# Patient Record
Sex: Female | Born: 2000 | Race: White | Hispanic: No | Marital: Single | Smoking: Never smoker
Health system: Southern US, Community
[De-identification: ages and names within clinical notes are randomized; demographics above are authoritative.]

## PROBLEM LIST (undated history)

## (undated) DIAGNOSIS — L509 Urticaria, unspecified: Secondary | ICD-10-CM

## (undated) DIAGNOSIS — J309 Allergic rhinitis, unspecified: Secondary | ICD-10-CM

## (undated) HISTORY — PX: WISDOM TOOTH EXTRACTION: SHX21

## (undated) HISTORY — PX: TONSILLECTOMY: SUR1361

## (undated) HISTORY — DX: Urticaria, unspecified: L50.9

## (undated) HISTORY — PX: ADENOIDECTOMY: SUR15

## (undated) HISTORY — DX: Allergic rhinitis, unspecified: J30.9

---
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
## 2019-08-22 ENCOUNTER — Encounter: Payer: Self-pay | Admitting: Nurse Practitioner

## 2019-09-17 ENCOUNTER — Ambulatory Visit: Payer: Managed Care, Other (non HMO) | Admitting: Nurse Practitioner

## 2019-09-19 ENCOUNTER — Ambulatory Visit: Payer: Self-pay | Admitting: Allergy and Immunology

## 2019-10-01 ENCOUNTER — Ambulatory Visit: Payer: Self-pay | Admitting: Allergy

## 2019-10-14 ENCOUNTER — Other Ambulatory Visit: Payer: Self-pay

## 2019-10-14 ENCOUNTER — Encounter: Payer: Self-pay | Admitting: Allergy and Immunology

## 2019-10-14 ENCOUNTER — Ambulatory Visit (INDEPENDENT_AMBULATORY_CARE_PROVIDER_SITE_OTHER): Payer: Managed Care, Other (non HMO) | Admitting: Allergy and Immunology

## 2019-10-14 VITALS — BP 112/68 | HR 88 | Resp 18 | Ht 63.0 in | Wt 181.4 lb

## 2019-10-14 DIAGNOSIS — T781XXA Other adverse food reactions, not elsewhere classified, initial encounter: Secondary | ICD-10-CM | POA: Diagnosis not present

## 2019-10-14 DIAGNOSIS — E739 Lactose intolerance, unspecified: Secondary | ICD-10-CM

## 2019-10-14 DIAGNOSIS — J301 Allergic rhinitis due to pollen: Secondary | ICD-10-CM | POA: Diagnosis not present

## 2019-10-14 DIAGNOSIS — H1010 Acute atopic conjunctivitis, unspecified eye: Secondary | ICD-10-CM

## 2019-10-14 DIAGNOSIS — J3089 Other allergic rhinitis: Secondary | ICD-10-CM

## 2019-10-14 MED ORDER — AUVI-Q 0.3 MG/0.3ML IJ SOAJ: 0.3000 mg | INTRAMUSCULAR | 3 refills | Status: DC | PRN

## 2019-10-14 NOTE — Patient Instructions (Addendum)
  1.  Allergen avoidance measures - pollens, cat, dog, dust mite, mold, specific foods  2.  Treat and prevent inflammation:   A. Montelukast 10 mg - 1 tablet 1 time per day  B. OTC Nasacort / Rhinocort - 1-2 sprays each nostril 3-7 times per week  3. If needed:   A. OTC Pataday - 1 tablet 1 time per day  B. OTC antihistamine - claritin / zyrtec 10 mg - 1 time per day  C. OTC nasal saline  D. OTC Lactaid    E. Auvi-Q 0.3, benadryl, MD/ER evaluation for allergy reaction  4. Further evaluation for GI issue?  5. Possible immunotherapy? Yes, If failing medical therapy  6. Obtain fall flu vaccine and Covid vaccine  7. Return to clinic in 1 year or earlier if problem

## 2019-10-14 NOTE — Progress Notes (Signed)
[place] - [place] - [place] - [place] - [place]   NEW PATIENT NOTE  Referring Provider: No ref. provider found Primary Provider: Gordan Payment., MD Date of office visit: 10/14/2019    Subjective:   Chief Complaint:  Victoria Coleman (DOB: [birth year]) is a 19 y.o. female who presents to the clinic on 10/14/2019 with a chief complaint of Allergic Rhinitis  and Urticaria .  HPI: Victoria Coleman presents to this clinic in evaluation of several issues.  First, she has a long history of seasonal allergic rhinitis manifested as nasal congestion and sneezing and itchy red watery eyes that has been a progressive issue since childhood.  Provoking factors for symptoms include exposure to cats and dogs and pollens.  She does believe that the use of montelukast helps the symptoms.  She does not use other medications at this point in time.  Occasionally she will get a sinus infection manifested as head pressure nasal congestion and she had one of these events about a month ago for which she was treated with a Z-Pak and prednisone.  Second, she develops hives.  She gets red raised areas on her skin predominately her lower extremities that never heal with scar or hyperpigmentation and only last a few hours and are very random.  Exposure to cats and dogs does appear to precipitate some of these issues if she has contact with those animals.  This is also been a longstanding issue.  Third, over the course of the past 5 years she has been having issues with food consumption.  She has recurrent abdominal pain and cramping and diarrhea about 3 times per week.  She recently underwent an upper endoscopy and a colonoscopy in investigation of this issue and apparently everything was okay.  She has noticed that when she eats a dairy meal she definitely gets this issue but even sometimes without a dairy meal she will develop this problem.  Significantly, it appears to be lactose that is giving rise to some of this issue  for if she eats lactose-free milk she does well.  Past Medical History:  Diagnosis Date  . Urticaria     Past Surgical History:  Procedure Laterality Date  . ADENOIDECTOMY    . TONSILLECTOMY    . WISDOM TOOTH EXTRACTION      Allergies as of 10/14/2019   No Known Allergies     Medication List      citalopram 20 MG tablet Commonly known as: CELEXA Take 20 mg by mouth 2 (two) times daily.   Junel FE 1/20 1-20 MG-MCG tablet Generic drug: norethindrone-ethinyl estradiol Take 1 tablet by mouth daily.   montelukast 10 MG tablet Commonly known as: Singulair Take 1 tablet (10 mg total) by mouth at bedtime. For cough or wheeze   Vyvanse 70 MG capsule Generic drug: lisdexamfetamine Take 70 mg by mouth every morning.       Review of systems negative except as noted in HPI / PMHx or noted below:  Review of Systems  Constitutional: Negative.   HENT: Negative.   Eyes: Negative.   Respiratory: Negative.   Cardiovascular: Negative.   Gastrointestinal: Negative.   Genitourinary: Negative.   Musculoskeletal: Negative.   Skin: Negative.   Neurological: Negative.   Endo/Heme/Allergies: Negative.   Psychiatric/Behavioral: Negative.     Family History  Problem Relation Age of Onset  . Rheum arthritis Mother   . Ulcerative colitis Brother     Social History   Socioeconomic History  . Marital status:  Single    Spouse name: Not on file  . Number of children: Not on file  . Years of education: Not on file  . Highest education level: Not on file  Occupational History  . Not on file  Tobacco Use  . Smoking status: Passive Smoke Exposure - Never Smoker  . Smokeless tobacco: Never Used  Substance and Sexual Activity  . Alcohol use: Yes    Comment: socially  . Drug use: Never  . Sexual activity: Not on file  Other Topics Concern  . Not on file  Social History Narrative  . Not on file    Environmental and Social history  Lives in a house with a dry environment,  dogs located to the hospital, carpet in the bedroom, plastic on the bed, plastic on the pillow, and no smoking ongoing inside the household.  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  Objective:   Vitals:   10/14/19 1427  BP: 112/68  Pulse: 88  Resp: 18  SpO2: 98%   Height: 5\' 3"  (160 cm) Weight: 181 lb 6.4 oz (82.3 kg)  Physical Exam Constitutional:      Appearance: She is not diaphoretic.  HENT:     Head: Normocephalic.     Right Ear: Tympanic membrane, ear canal and external ear normal.     Left Ear: Tympanic membrane, ear canal and external ear normal.     Nose: Nose normal. No mucosal edema or rhinorrhea.     Mouth/Throat:     Pharynx: Uvula midline. No oropharyngeal exudate.  Eyes:     Conjunctiva/sclera: Conjunctivae normal.  Neck:     Thyroid: No thyromegaly.     Trachea: Trachea normal. No tracheal tenderness or tracheal deviation.  Cardiovascular:     Rate and Rhythm: Normal rate and regular rhythm.     Heart sounds: Normal heart sounds, S1 normal and S2 normal. No murmur heard.   Pulmonary:     Effort: No respiratory distress.     Breath sounds: Normal breath sounds. No stridor. No wheezing or rales.  Lymphadenopathy:     Head:     Right side of head: No tonsillar adenopathy.     Left side of head: No tonsillar adenopathy.     Cervical: No cervical adenopathy.  Skin:    Findings: No erythema or rash.     Nails: There is no clubbing.  Neurological:     Mental Status: She is alert.     Diagnostics: Allergy skin tests were performed.  She demonstrated hypersensitivity to grasses, weeds, trees, mold, dog, cat, and house dust mite.  She also demonstrated hypersensitivity to almond, hazelnut, nut, Trout, tuna, hops, chicken meat, apple, cinnamon.  Assessment and Plan:    1. Perennial allergic rhinitis   2. Seasonal allergic rhinitis due to pollen   3. Seasonal allergic conjunctivitis   4. Adverse food reaction, initial  encounter   5. Lactose intolerance     1.  Allergen avoidance measures - pollens, cat, dog, dust mite, mold, specific foods  2.  Treat and prevent inflammation:   A. Montelukast 10 mg - 1 tablet 1 time per day  B. OTC Nasacort / Rhinocort - 1-2 sprays each nostril 3-7 times per week  3. If needed:   A. OTC Pataday - 1 tablet 1 time per day  B. OTC antihistamine - claritin / zyrtec 10 mg - 1 time per day  C. OTC nasal saline  D. OTC  Lactaid    E. Auvi-Q 0.3, benadryl, MD/ER evaluation for allergy reaction  4. Further evaluation for GI issue?  5. Possible immunotherapy? Yes, If failing medical therapy  6. Obtain fall flu vaccine and Covid vaccine  7. Return to clinic in 1 year or earlier if problem  Victoria Coleman is very atopic and we will get her to perform allergen avoidance measures as best as possible and consistently use some anti-inflammatory agents for her airway as noted above.  She very well could be having an adverse response to specific food consumption giving rise to some of her GI issues and she will need to work through this issue by performing elimination diet directed against each of the foods she demonstrated hypersensitivity to on today skin testing.  We have given her an injectable epinephrine device given the extent of her atopic disease and her risk for developing a food reaction in the future.  She would definitely be a candidate for immunotherapy as she has basically failed medical therapy regarding her atopic disease and she is presently considering this option.  I will see her back in this clinic in 1 year or earlier if there is a problem.  Laurette Schimke, MD Allergy / Immunology [facility]

## 2019-10-15 ENCOUNTER — Encounter: Payer: Self-pay | Admitting: Allergy and Immunology

## 2019-12-16 ENCOUNTER — Telehealth: Payer: Self-pay | Admitting: Allergy and Immunology

## 2019-12-16 NOTE — Telephone Encounter (Signed)
Victoria Coleman and states she has followed Victoria Coleman action plan strictly and it is of no help.  Victoria Coleman would like to know if she can start immunotherapy.

## 2019-12-16 NOTE — Telephone Encounter (Signed)
Left a message for Victoria Coleman to call back. Just need to inform her that she will need to come by the office to sign consent for [place] to make vials for her.

## 2019-12-16 NOTE — Telephone Encounter (Signed)
Please inform Victoria Coleman that she can start immunotherapy.  She will just need to contact us and let us know when she would like to initiate that form of treatment.

## 2020-01-01 ENCOUNTER — Other Ambulatory Visit: Payer: Self-pay | Admitting: Allergy and Immunology

## 2020-01-01 DIAGNOSIS — H1010 Acute atopic conjunctivitis, unspecified eye: Secondary | ICD-10-CM

## 2020-01-01 DIAGNOSIS — J3081 Allergic rhinitis due to animal (cat) (dog) hair and dander: Secondary | ICD-10-CM | POA: Diagnosis not present

## 2020-01-01 DIAGNOSIS — J301 Allergic rhinitis due to pollen: Secondary | ICD-10-CM

## 2020-01-01 DIAGNOSIS — J3089 Other allergic rhinitis: Secondary | ICD-10-CM

## 2020-01-01 NOTE — Progress Notes (Signed)
VIALS EXP 12-31-20 

## 2020-01-02 DIAGNOSIS — J3089 Other allergic rhinitis: Secondary | ICD-10-CM | POA: Diagnosis not present

## 2020-01-13 ENCOUNTER — Ambulatory Visit (INDEPENDENT_AMBULATORY_CARE_PROVIDER_SITE_OTHER): Payer: Managed Care, Other (non HMO) | Admitting: *Deleted

## 2020-01-13 ENCOUNTER — Other Ambulatory Visit: Payer: Self-pay

## 2020-01-13 DIAGNOSIS — J309 Allergic rhinitis, unspecified: Secondary | ICD-10-CM | POA: Diagnosis not present

## 2020-01-13 NOTE — Progress Notes (Signed)
Immunotherapy   Patient Details  Name: Victoria Coleman MRN: [id number] Date of Birth: 02-20-2001  01/13/2020  Lenis Noon - [facility] ; [facility] (12/31/20) Following schedule: B  Frequency: 1-2 TIMES WEEKLY Epi-Pen: YES Consent signed and patient instructions given.   Redge Gainer 01/13/2020, 10:51 AM

## 2020-01-23 ENCOUNTER — Ambulatory Visit (INDEPENDENT_AMBULATORY_CARE_PROVIDER_SITE_OTHER): Payer: Managed Care, Other (non HMO) | Admitting: *Deleted

## 2020-01-23 DIAGNOSIS — J309 Allergic rhinitis, unspecified: Secondary | ICD-10-CM | POA: Diagnosis not present

## 2020-01-30 ENCOUNTER — Ambulatory Visit (INDEPENDENT_AMBULATORY_CARE_PROVIDER_SITE_OTHER): Payer: Managed Care, Other (non HMO) | Admitting: *Deleted

## 2020-01-30 DIAGNOSIS — J309 Allergic rhinitis, unspecified: Secondary | ICD-10-CM | POA: Diagnosis not present

## 2020-02-19 ENCOUNTER — Ambulatory Visit (INDEPENDENT_AMBULATORY_CARE_PROVIDER_SITE_OTHER): Payer: Managed Care, Other (non HMO) | Admitting: *Deleted

## 2020-02-19 DIAGNOSIS — J309 Allergic rhinitis, unspecified: Secondary | ICD-10-CM

## 2020-03-10 ENCOUNTER — Ambulatory Visit (INDEPENDENT_AMBULATORY_CARE_PROVIDER_SITE_OTHER): Payer: Managed Care, Other (non HMO)

## 2020-03-10 DIAGNOSIS — J309 Allergic rhinitis, unspecified: Secondary | ICD-10-CM

## 2020-04-30 ENCOUNTER — Ambulatory Visit (INDEPENDENT_AMBULATORY_CARE_PROVIDER_SITE_OTHER): Payer: Managed Care, Other (non HMO) | Admitting: *Deleted

## 2020-04-30 DIAGNOSIS — J309 Allergic rhinitis, unspecified: Secondary | ICD-10-CM

## 2020-05-21 ENCOUNTER — Ambulatory Visit (INDEPENDENT_AMBULATORY_CARE_PROVIDER_SITE_OTHER): Payer: Managed Care, Other (non HMO) | Admitting: *Deleted

## 2020-05-21 DIAGNOSIS — J309 Allergic rhinitis, unspecified: Secondary | ICD-10-CM

## 2020-06-11 ENCOUNTER — Ambulatory Visit (INDEPENDENT_AMBULATORY_CARE_PROVIDER_SITE_OTHER): Payer: Managed Care, Other (non HMO) | Admitting: *Deleted

## 2020-06-11 DIAGNOSIS — J309 Allergic rhinitis, unspecified: Secondary | ICD-10-CM | POA: Diagnosis not present

## 2020-06-25 ENCOUNTER — Ambulatory Visit (INDEPENDENT_AMBULATORY_CARE_PROVIDER_SITE_OTHER): Payer: Managed Care, Other (non HMO) | Admitting: *Deleted

## 2020-06-25 DIAGNOSIS — J309 Allergic rhinitis, unspecified: Secondary | ICD-10-CM

## 2020-07-16 ENCOUNTER — Ambulatory Visit (INDEPENDENT_AMBULATORY_CARE_PROVIDER_SITE_OTHER): Payer: Managed Care, Other (non HMO)

## 2020-07-16 DIAGNOSIS — J309 Allergic rhinitis, unspecified: Secondary | ICD-10-CM | POA: Diagnosis not present

## 2020-07-23 ENCOUNTER — Ambulatory Visit (INDEPENDENT_AMBULATORY_CARE_PROVIDER_SITE_OTHER): Payer: Managed Care, Other (non HMO) | Admitting: *Deleted

## 2020-07-23 DIAGNOSIS — J309 Allergic rhinitis, unspecified: Secondary | ICD-10-CM

## 2020-08-13 ENCOUNTER — Ambulatory Visit (INDEPENDENT_AMBULATORY_CARE_PROVIDER_SITE_OTHER): Payer: Managed Care, Other (non HMO) | Admitting: *Deleted

## 2020-08-13 DIAGNOSIS — J309 Allergic rhinitis, unspecified: Secondary | ICD-10-CM | POA: Diagnosis not present

## 2020-10-12 ENCOUNTER — Other Ambulatory Visit: Payer: Self-pay

## 2020-10-12 ENCOUNTER — Encounter: Payer: Self-pay | Admitting: Allergy and Immunology

## 2020-10-12 ENCOUNTER — Ambulatory Visit: Payer: Managed Care, Other (non HMO) | Admitting: Allergy and Immunology

## 2020-10-12 VITALS — BP 118/74 | HR 72 | Resp 16 | Ht 63.0 in | Wt 163.6 lb

## 2020-10-12 DIAGNOSIS — J3089 Other allergic rhinitis: Secondary | ICD-10-CM | POA: Diagnosis not present

## 2020-10-12 DIAGNOSIS — E739 Lactose intolerance, unspecified: Secondary | ICD-10-CM

## 2020-10-12 DIAGNOSIS — J301 Allergic rhinitis due to pollen: Secondary | ICD-10-CM

## 2020-10-12 DIAGNOSIS — H1013 Acute atopic conjunctivitis, bilateral: Secondary | ICD-10-CM | POA: Diagnosis not present

## 2020-10-12 DIAGNOSIS — H1010 Acute atopic conjunctivitis, unspecified eye: Secondary | ICD-10-CM

## 2020-10-12 DIAGNOSIS — T781XXD Other adverse food reactions, not elsewhere classified, subsequent encounter: Secondary | ICD-10-CM

## 2020-10-12 NOTE — Progress Notes (Signed)
[place] - [place] - [place] - [place] - [place]   Follow-up Note  Referring Provider: No ref. provider found Primary Provider: Gordan Payment., MD Date of Office Visit: 10/12/2020  Subjective:   Victoria Coleman (DOB: 07-01-2000) is a 20 y.o. female [facility] in re-evaluation of the following:  HPI: Victoria Coleman to this clinic in reevaluation of allergic rhinoconjunctivitis, adverse food reaction with component of oral food allergy, and apparent lactose intolerance.  Victoria Coleman last visit to this clinic was Victoria Coleman initial evaluation of 14 October 2019.  Victoria Coleman did start a course of immunotherapy towards the latter part of last summer and was doing well with that form of treatment but unfortunately because of the logistic issue and a financial issue Victoria Coleman had to discontinue this form of therapy about 6 weeks ago.  Victoria Coleman did very well this past spring.  Victoria Coleman was able to go through the spring using montelukast and rarely some antihistamine and rarely some nasal steroid and rarely some eyedrops.  The only food that Victoria Coleman remains away from eating at this point in time is chicken and Victoria Coleman does not really eat tree nuts and Victoria Coleman stays away from apples because they give rise to itchiness in Victoria Coleman throat.  If Victoria Coleman is preparing chicken Victoria Coleman will sometimes develop a rash at areas of contact.  Victoria Coleman now can consume not just lactose-free milk but also 1% milk without much difficulty.  Victoria Coleman did not obtain a COVID-vaccine but Victoria Coleman did obtained a flu vaccine last year.  Victoria Coleman was infected with COVID in 2021 without any long-term sequela.  Allergies as of 10/12/2020   No Known Allergies      Medication List    Auvi-Q 0.3 mg/0.3 mL Soaj injection Generic drug: EPINEPHrine Inject 0.3 mLs (0.3 mg total) into the muscle as needed for anaphylaxis.   citalopram 20 MG tablet Commonly known as: CELEXA Take 20 mg by mouth 2 (two) times daily.   Junel FE 1/20 1-20 MG-MCG  tablet Generic drug: norethindrone-ethinyl estradiol-FE Take 1 tablet by mouth daily.   montelukast 10 MG tablet Commonly known as: Singulair Take 1 tablet (10 mg total) by mouth at bedtime. For cough or wheeze   neomycin-polymyxin-hydrocortisone 3.5-10000-1 OTIC suspension Commonly known as: CORTISPORIN SMARTSIG:In Ear(s)   Vyvanse 70 MG capsule Generic drug: lisdexamfetamine Take 70 mg by mouth every morning.        Past Medical History:  Diagnosis Date   Urticaria     Past Surgical History:  Procedure Laterality Date   ADENOIDECTOMY     TONSILLECTOMY     WISDOM TOOTH EXTRACTION      Review of systems negative except as noted in HPI / PMHx or noted below:  Review of Systems  Constitutional: Negative.   HENT: Negative.    Eyes: Negative.   Respiratory: Negative.    Cardiovascular: Negative.   Gastrointestinal: Negative.   Genitourinary: Negative.   Musculoskeletal: Negative.   Skin: Negative.   Neurological: Negative.   Endo/Heme/Allergies: Negative.   Psychiatric/Behavioral: Negative.      Objective:   Vitals:   10/12/20 1344  BP: 118/74  Pulse: 72  Resp: 16  SpO2: 98%   Height: 5\' 3"  (160 cm)  Weight: 163 lb 9.6 oz (74.2 kg)   Physical Exam Constitutional:      Appearance: Victoria Coleman is not diaphoretic.  HENT:     Head: Normocephalic.     Right Ear: Tympanic membrane, ear canal and  external ear normal.     Left Ear: Tympanic membrane, ear canal and external ear normal.     Nose: Nose normal. No mucosal edema or rhinorrhea.     Mouth/Throat:     Pharynx: Uvula midline. No oropharyngeal exudate.  Eyes:     Conjunctiva/sclera: Conjunctivae normal.  Neck:     Thyroid: No thyromegaly.     Trachea: Trachea normal. No tracheal tenderness or tracheal deviation.  Cardiovascular:     Rate and Rhythm: Normal rate and regular rhythm.     Heart sounds: Normal heart sounds, S1 normal and S2 normal. No murmur heard. Pulmonary:     Effort: No respiratory  distress.     Breath sounds: Normal breath sounds. No stridor. No wheezing or rales.  Lymphadenopathy:     Head:     Right side of head: No tonsillar adenopathy.     Left side of head: No tonsillar adenopathy.     Cervical: No cervical adenopathy.  Skin:    Findings: No erythema or rash.     Nails: There is no clubbing.  Neurological:     Mental Status: Victoria Coleman is alert.    Diagnostics: none  Assessment and Plan:   1. Perennial allergic rhinitis   2. Seasonal allergic conjunctivitis   3. Seasonal allergic rhinitis due to pollen   4. Adverse food reaction, subsequent encounter   5. Pollen-food allergy, subsequent encounter   6. Lactose intolerance    1.  Allergen avoidance measures - pollens, cat, dog, dust mite, mold, specific foods  2.  Treat and prevent inflammation:   A. Montelukast 10 mg - 1 tablet 1 time per day  3. If needed:   A. OTC Pataday - 1 tablet 1 time per day  B. OTC antihistamine - claritin / zyrtec 10 mg - 1 time per day  C. OTC nasal saline  D. OTC Lactaid    E. Auvi-Q 0.3, benadryl, MD/ER evaluation for allergy reaction  F. OTC Nasacort / Rhinocort - 1-2 sprays each nostril 3-7 times per week  4. Obtain fall flu vaccine  5. Immunotherapy???  6. Obtain fall flu vaccine    7. Return to clinic in 1 year or earlier if problem  Lachlan appears to be doing better and with some attention allergen avoidance measures and the use of a leukotriene modifier on a consistent basis and some immunotherapy Victoria Coleman has really done much better as Victoria Coleman has gone through the spring season and Victoria Coleman is done much better with exposure to specific aeroallergens.  Victoria Coleman still appears to have some sensitivity regarding specific food consumption.  It is going to be difficult for Victoria Coleman to continue using immunotherapy based upon some logistical issues and financial issues at this point in time.  I will see Victoria Coleman back in this clinic in 1 year or earlier if there is a problem.  Laurette Schimke,  MD Allergy / Immunology [facility]

## 2020-10-12 NOTE — Patient Instructions (Addendum)
  1.  Allergen avoidance measures - pollens, cat, dog, dust mite, mold, specific foods  2.  Treat and prevent inflammation:   A. Montelukast 10 mg - 1 tablet 1 time per day  3. If needed:   A. OTC Pataday - 1 tablet 1 time per day  B. OTC antihistamine - claritin / zyrtec 10 mg - 1 time per day  C. OTC nasal saline  D. OTC Lactaid    E. Auvi-Q 0.3, benadryl, MD/ER evaluation for allergy reaction  F. OTC Nasacort / Rhinocort - 1-2 sprays each nostril 3-7 times per week  4. Obtain fall flu vaccine  5. Immunotherapy???  6. Obtain fall flu vaccine    7. Return to clinic in 1 year or earlier if problem

## 2020-10-13 ENCOUNTER — Encounter: Payer: Self-pay | Admitting: Allergy and Immunology

## 2021-07-19 ENCOUNTER — Encounter: Payer: Self-pay | Admitting: Allergy and Immunology

## 2021-07-19 ENCOUNTER — Ambulatory Visit: Payer: Managed Care, Other (non HMO) | Admitting: Allergy and Immunology

## 2021-07-19 VITALS — BP 112/60 | HR 76 | Resp 14 | Ht 62.5 in | Wt 183.0 lb

## 2021-07-19 DIAGNOSIS — J3089 Other allergic rhinitis: Secondary | ICD-10-CM

## 2021-07-19 DIAGNOSIS — T781XXD Other adverse food reactions, not elsewhere classified, subsequent encounter: Secondary | ICD-10-CM

## 2021-07-19 DIAGNOSIS — H1013 Acute atopic conjunctivitis, bilateral: Secondary | ICD-10-CM

## 2021-07-19 DIAGNOSIS — H1010 Acute atopic conjunctivitis, unspecified eye: Secondary | ICD-10-CM

## 2021-07-19 DIAGNOSIS — J301 Allergic rhinitis due to pollen: Secondary | ICD-10-CM

## 2021-07-19 MED ORDER — AUVI-Q 0.3 MG/0.3ML IJ SOAJ: INTRAMUSCULAR | 3 refills | Status: AC

## 2021-07-19 NOTE — Patient Instructions (Addendum)
?  1.  Allergen avoidance measures - pollens, cat, dog, dust mite, mold, specific foods ? ?2.  Treat and prevent inflammation: ? ? A. Montelukast 10 mg - 1 tablet 1 time per day ? B. OTC Nasacort / Rhinocort - 1-2 sprays each nostril 3-7 times per week ? C. Prednisone 10 mg - 1 tablet 1 time per day for 10 days only ? ?3. If needed: ? ? A. OTC Pataday - 1 drop 1 time per day ? B. OTC antihistamine - claritin / zyrtec 10 mg - 1 time per day ? C. OTC nasal saline ? D. Auvi-Q 0.3 / Epi-Pen, benadryl, MD/ER evaluation for allergy reaction ? ?4. Return to clinic in 1 year or earlier if problem ?

## 2021-07-19 NOTE — Progress Notes (Signed)
? ?[place] ? ? ?Follow-up Note ? ?Referring Provider: Raina Mina., MD ?Primary Provider: Raina Mina., MD ?Date of Office Visit: 07/19/2021 ? ?Subjective:  ? ?Victoria Coleman (DOB: [birth year]) is a 21 y.o. female who [facility] 07[facility]20[facility] in re-evaluation of the following: ? 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  I last saw her in this clinic on 12 October 2020. ? ?She has developed a problem now that the spring is here.  She has nasal congestion and sneezing and head congestion and it is not responding to her current therapy which includes some antihistamines and consistent use of montelukast.  As well, a dog been introduced inside the household in January 2023 and she can definitely tell that this exposure is leading to some problems with her nose. ? ?She still gets problems with her mouth when eating a raw apple.  She remains away from chicken consumption as it gives rise to a rash. ? ?Allergies as of 07/19/2021   ?No Known Allergies ?  ? ?  ?Medication List  ? ? ?Auvi-Q 0.3 mg/0.3 mL Soaj injection ?Generic drug: EPINEPHrine ?Inject 0.3 mLs (0.3 mg total) into the muscle as needed for anaphylaxis. ?  ?buPROPion 150 MG 12 hr tablet ?Commonly known as: WELLBUTRIN SR ?Take 150 mg by mouth 2 (two) times daily. ?  ?montelukast 10 MG tablet ?Commonly known as: Singulair ?Take 1 tablet (10 mg total) by mouth at bedtime. For cough or wheeze ?  ?paragard intrauterine copper Iud IUD ?1 each by Intrauterine route once. ?  ?VITAMIN D-VITAMIN K PO ?Take by mouth daily. ?  ?Vyvanse 70 MG capsule ?Generic drug: lisdexamfetamine ?Take 70 mg by mouth every morning. ?  ? ?Past Medical History:  ?Diagnosis Date  ? Allergic rhinitis   ? Urticaria   ? ? ?Past Surgical History:  ?Procedure Laterality Date  ? ADENOIDECTOMY    ?  TONSILLECTOMY    ? WISDOM TOOTH EXTRACTION    ? ? ?Review of systems negative except as noted in HPI / PMHx or noted below: ? ?Review of Systems  ?Constitutional: Negative.   ?HENT: Negative.    ?Eyes: Negative.   ?Respiratory: Negative.    ?Cardiovascular: Negative.   ?Gastrointestinal: Negative.   ?Genitourinary: Negative.   ?Musculoskeletal: Negative.   ?Skin: Negative.   ?Neurological: Negative.   ?Endo/Heme/Allergies: Negative.   ?Psychiatric/Behavioral: Negative.    ? ? ?Objective:  ? ?Vitals:  ? 07/19/21 1519  ?BP: 112/60  ?Pulse: 76  ?Resp: 14  ?SpO2: 98%  ? ?Height: 5' 2.5" (158.8 cm)  ?Weight: 183 lb (83 kg)  ? ?Physical Exam ?Constitutional:   ?   Appearance: She is not diaphoretic.  ?HENT:  ?   Head: Normocephalic.  ?   Right Ear: Tympanic membrane, ear canal and external ear normal.  ?   Left Ear: Tympanic membrane, ear canal and external ear normal.  ?   Nose: Mucosal edema present. No rhinorrhea.  ?   Mouth/Throat:  ?   Pharynx: Uvula midline. No oropharyngeal exudate.  ?Eyes:  ?   Conjunctiva/sclera: Conjunctivae normal.  ?Neck:  ?   Thyroid: No thyromegaly.  ?   Trachea: Trachea normal. No tracheal tenderness or tracheal deviation.  ?Cardiovascular:  ?   Rate and Rhythm: Normal rate and regular rhythm.  ?   Heart sounds: Normal heart sounds,  S1 normal and S2 normal. No murmur heard. ?Pulmonary:  ?   Effort: No respiratory distress.  ?   Breath sounds: Normal breath sounds. No stridor. No wheezing or rales.  ?Lymphadenopathy:  ?   Head:  ?   Right side of head: No tonsillar adenopathy.  ?   Left side of head: No tonsillar adenopathy.  ?   Cervical: No cervical adenopathy.  ?Skin: ?   Findings: No erythema or rash.  ?   Nails: There is no clubbing.  ?Neurological:  ?   Mental Status: She is alert.  ? ? ?Diagnostics: none ? ?Assessment and Plan:  ? ?1. Perennial allergic rhinitis   ?2. Seasonal allergic rhinitis due to pollen   ?3. Seasonal allergic conjunctivitis   ?4. Pollen-food allergy, subsequent  encounter   ?5. Adverse food reaction, subsequent encounter   ? ? ?1.  Allergen avoidance measures - pollens, cat, dog, dust mite, mold, specific foods ? ?2.  Treat and prevent inflammation: ? ? A. Montelukast 10 mg - 1 tablet 1 time per day ? B. OTC Nasacort / Rhinocort - 1-2 sprays each nostril 3-7 times per week ? C. Prednisone 10 mg - 1 tablet 1 time per day for 10 days only ? ?3. If needed: ? ? A. OTC Pataday - 1 drop 1 time per day ? B. OTC antihistamine - claritin / zyrtec 10 mg - 1 time per day ? C. OTC nasal saline ? D. Auvi-Q 0.3 / Epi-Pen, benadryl, MD/ER evaluation for allergy reaction ? ?4. Return to clinic in 1 year or earlier if problem ? ?Victoria Coleman will utilize a collection of anti-inflammatory agents along with a short course of systemic steroids to help with her respiratory tract inflammation and she has a selection of agents to be utilized should they be required as noted above.  Assuming she does well with this plan I will see her back in this clinic in 1 year or earlier if there is a problem. ? ?Allena Katz, MD ?Allergy / Immunology ?[place] ?

## 2021-07-20 ENCOUNTER — Encounter: Payer: Self-pay | Admitting: Allergy and Immunology

## 2021-10-13 ENCOUNTER — Ambulatory Visit: Payer: Managed Care, Other (non HMO) | Admitting: Allergy and Immunology

## 2022-07-20 ENCOUNTER — Ambulatory Visit: Payer: Managed Care, Other (non HMO) | Admitting: Allergy and Immunology

## 2022-07-20 DIAGNOSIS — J309 Allergic rhinitis, unspecified: Secondary | ICD-10-CM

## 2023-11-17 ENCOUNTER — Encounter: Payer: Self-pay | Admitting: Internal Medicine

## 2023-11-17 ENCOUNTER — Ambulatory Visit (INDEPENDENT_AMBULATORY_CARE_PROVIDER_SITE_OTHER): Admitting: Internal Medicine

## 2023-11-17 VITALS — BP 118/60 | HR 90 | Resp 18

## 2023-11-17 DIAGNOSIS — J302 Other seasonal allergic rhinitis: Secondary | ICD-10-CM

## 2023-11-17 DIAGNOSIS — T7800XD Anaphylactic reaction due to unspecified food, subsequent encounter: Secondary | ICD-10-CM

## 2023-11-17 DIAGNOSIS — J3089 Other allergic rhinitis: Secondary | ICD-10-CM | POA: Diagnosis not present

## 2023-11-17 DIAGNOSIS — H1045 Other chronic allergic conjunctivitis: Secondary | ICD-10-CM | POA: Diagnosis not present

## 2023-11-17 DIAGNOSIS — T7800XA Anaphylactic reaction due to unspecified food, initial encounter: Secondary | ICD-10-CM

## 2023-11-17 DIAGNOSIS — T50905A Adverse effect of unspecified drugs, medicaments and biological substances, initial encounter: Secondary | ICD-10-CM

## 2023-11-17 DIAGNOSIS — Z888 Allergy status to other drugs, medicaments and biological substances status: Secondary | ICD-10-CM

## 2023-11-17 NOTE — Progress Notes (Signed)
 FOLLOW UP Date of Service/Encounter:  11/21/23  Subjective:  Victoria Coleman (DOB: [birth year]) is a 23 y.o. female who [facility] in re-evaluation of the following: allergic rhinitis,  food allergy  History obtained from: chart review and patient.  For Review, LV was on 07/19/22  with Dr. Maurilio seen for routine follow-up. See below for summary of history and diagnostics.   Today presents for follow-up. Discussed the use of AI scribe software for clinical note transcription with the patient, who gave verbal consent to proceed.  History of Present Illness Victoria Coleman is a 23 year old female who presents with concerns about side effects from montelukast  and allergy  management.  Neuropsychiatric and cognitive symptoms associated with montelukast  - Ten-year history of montelukast  use for allergy  management - Significant side effects including brain fog and nonspecific 'weird symptoms' - Progressive worsening of symptoms, now impacting ability to function, especially in pharmacy school - OCD tendencies during exams in first year of pharmacy school, now progressed to inability to function effectively - After taking 10 mg montelukast  on a Monday night, experienced feeling unwell the following day - Discontinuation of montelukast  led to improvement in cognitive function, particularly when taking Vyvanse  Visual disturbance - Change in vision described as 'a film over it,' more pronounced in the right eye - Vision changes noticed on days without montelukast  - Wears new contact lenses, but visual symptoms appear to correlate with absence of montelukast   Allergic rhinitis and allergen exposure - History of seasonal allergies, with fall being the most symptomatic season - Currently not taking any allergy  medications - Considering starting Allegra for symptom control - Previously received allergy  immunotherapy (allergy  shots), discontinued due to  logistical issues - Considering resuming allergy  shots if necessary  Food allergies and avoidance behaviors - Avoids cinnamon due to oral numbness, headaches, and stomach pain - Avoids raw apples, tolerates cooked apples - Avoids hazelnuts, [place] nuts, and almonds - Consumes peanuts and peanut butter without issue - EpiPen is expired     All medications reviewed by clinical staff and updated in chart. No new pertinent medical or surgical history except as noted in HPI.  ROS: All others negative except as noted per HPI.   Objective:  BP 118/60   Pulse 90   Resp 18   SpO2 99%  There is no height or weight on file to calculate BMI. Physical Exam: General Appearance:  Alert, cooperative, no distress, appears stated age  Head:  Normocephalic, without obvious abnormality, atraumatic  Eyes:  Conjunctiva clear, EOM's intact  Ears EACs normal bilaterally  Nose: Nares normal, erythematous nasal mucosa , hypertrophic turbinates, no visible anterior polyps, and septum midline  Throat: Lips, tongue normal; teeth and gums normal, normal posterior oropharynx  Neck: Supple, symmetrical  Lungs:   clear to auscultation bilaterally, Respirations unlabored, no coughing  Heart:  regular rate and rhythm and no murmur, Appears well perfused  Extremities: No edema  Skin: Skin color, texture, turgor normal and no rashes or lesions on visualized portions of skin  Neurologic: No gross deficits   Labs:  Lab Orders  No laboratory test(s) ordered today      Assessment/Plan   Patient Instructions  1.  Allergen avoidance measures - pollens, cat, dog, dust mite, mold, specific foods   2.  Treat and prevent inflammation:               A. Allegra 180mg  daily, generic is okay  B. Avoid montelukast              3. If needed:               A. OTC Pataday  - 1 drop 1 time per day  B. Flonase  1 spray per nostril daily as needed              C. Auvi-Q  0.3 / Epi-Pen, benadryl, MD/ER  evaluation for allergy  reaction  4. Codes given for immunotherapy options    5. Return to clinic in 6 months. Let us  know if you want to start allergy  injections   Other:    Thank you so much for letting me partake in your care today.  Don't hesitate to reach out if you have any additional concerns!  Hargis Springer, MD  [facility] [place], [place]

## 2023-11-17 NOTE — Patient Instructions (Addendum)
 1.  Allergen avoidance measures - pollens, cat, dog, dust mite, mold, specific foods   2.  Treat and prevent inflammation:               A. Allegra 180mg  daily, generic is okay              B. Avoid montelukast              3. If needed:               A. OTC Pataday - 1 drop 1 time per day  B. Flonase 1 spray per nostril daily as needed              C. Auvi-Q  0.3 / Epi-Pen, benadryl, MD/ER evaluation for allergy  reaction  4. Codes given for immunotherapy options    5. Return to clinic in 6 months. Let us  know if you want to start allergy  injections

## 2023-11-21 MED ORDER — OLOPATADINE HCL 0.2 % OP SOLN: 1.0000 [drp] | Freq: Two times a day (BID) | OPHTHALMIC | 5 refills | Status: AC | PRN

## 2023-11-21 MED ORDER — FLUTICASONE PROPIONATE 50 MCG/ACT NA SUSP: 1.0000 | Freq: Every day | NASAL | 2 refills | Status: AC

## 2023-12-06 ENCOUNTER — Other Ambulatory Visit: Payer: Self-pay | Admitting: Medical Genetics

## 2023-12-19 ENCOUNTER — Other Ambulatory Visit: Payer: Self-pay

## 2024-04-18 ENCOUNTER — Other Ambulatory Visit: Payer: Self-pay | Admitting: Medical Genetics

## 2024-04-18 DIAGNOSIS — Z006 Encounter for examination for normal comparison and control in clinical research program: Secondary | ICD-10-CM

## 2024-11-13 ENCOUNTER — Ambulatory Visit: Admitting: Allergy and Immunology
